# Patient Record
Sex: Male | Born: 1975 | Race: Black or African American | Hispanic: No | Marital: Single | Smoking: Former smoker
Health system: Southern US, Community
[De-identification: ages and names within clinical notes are randomized; demographics above are authoritative.]

## PROBLEM LIST (undated history)

## (undated) DIAGNOSIS — R569 Unspecified convulsions: Secondary | ICD-10-CM

---

## 1998-02-05 ENCOUNTER — Emergency Department (HOSPITAL_COMMUNITY): Admission: EM | Admit: 1998-02-05 | Discharge: 1998-02-05 | Payer: Self-pay | Admitting: Emergency Medicine

## 1998-02-05 ENCOUNTER — Encounter: Payer: Self-pay | Admitting: Emergency Medicine

## 1998-07-01 ENCOUNTER — Emergency Department (HOSPITAL_COMMUNITY): Admission: EM | Admit: 1998-07-01 | Discharge: 1998-07-01 | Payer: Self-pay | Admitting: Emergency Medicine

## 1998-07-01 ENCOUNTER — Encounter: Payer: Self-pay | Admitting: Emergency Medicine

## 1998-09-16 ENCOUNTER — Observation Stay (HOSPITAL_COMMUNITY): Admission: EM | Admit: 1998-09-16 | Discharge: 1998-09-17 | Payer: Self-pay

## 1998-09-16 ENCOUNTER — Encounter: Payer: Self-pay | Admitting: Emergency Medicine

## 1998-11-22 ENCOUNTER — Emergency Department (HOSPITAL_COMMUNITY): Admission: EM | Admit: 1998-11-22 | Discharge: 1998-11-22 | Payer: Self-pay | Admitting: Emergency Medicine

## 1999-03-01 ENCOUNTER — Emergency Department (HOSPITAL_COMMUNITY): Admission: EM | Admit: 1999-03-01 | Discharge: 1999-03-01 | Payer: Self-pay | Admitting: *Deleted

## 1999-09-07 ENCOUNTER — Emergency Department (HOSPITAL_COMMUNITY): Admission: EM | Admit: 1999-09-07 | Discharge: 1999-09-07 | Payer: Self-pay | Admitting: Emergency Medicine

## 2000-12-02 ENCOUNTER — Encounter: Payer: Self-pay | Admitting: Emergency Medicine

## 2000-12-02 ENCOUNTER — Emergency Department (HOSPITAL_COMMUNITY): Admission: EM | Admit: 2000-12-02 | Discharge: 2000-12-02 | Payer: Self-pay | Admitting: Emergency Medicine

## 2001-07-02 ENCOUNTER — Encounter: Payer: Self-pay | Admitting: Emergency Medicine

## 2001-07-02 ENCOUNTER — Emergency Department (HOSPITAL_COMMUNITY): Admission: EM | Admit: 2001-07-02 | Discharge: 2001-07-02 | Payer: Self-pay | Admitting: Emergency Medicine

## 2001-10-24 ENCOUNTER — Emergency Department (HOSPITAL_COMMUNITY): Admission: EM | Admit: 2001-10-24 | Discharge: 2001-10-24 | Payer: Self-pay | Admitting: Emergency Medicine

## 2002-06-21 ENCOUNTER — Emergency Department (HOSPITAL_COMMUNITY): Admission: EM | Admit: 2002-06-21 | Discharge: 2002-06-21 | Payer: Self-pay

## 2002-12-07 ENCOUNTER — Emergency Department (HOSPITAL_COMMUNITY): Admission: EM | Admit: 2002-12-07 | Discharge: 2002-12-07 | Payer: Self-pay | Admitting: Emergency Medicine

## 2003-03-03 ENCOUNTER — Emergency Department (HOSPITAL_COMMUNITY): Admission: EM | Admit: 2003-03-03 | Discharge: 2003-03-03 | Payer: Self-pay | Admitting: Emergency Medicine

## 2003-04-01 ENCOUNTER — Emergency Department (HOSPITAL_COMMUNITY): Admission: EM | Admit: 2003-04-01 | Discharge: 2003-04-02 | Payer: Self-pay | Admitting: *Deleted

## 2003-04-16 ENCOUNTER — Emergency Department (HOSPITAL_COMMUNITY): Admission: EM | Admit: 2003-04-16 | Discharge: 2003-04-16 | Payer: Self-pay | Admitting: Emergency Medicine

## 2003-06-13 ENCOUNTER — Emergency Department (HOSPITAL_COMMUNITY): Admission: EM | Admit: 2003-06-13 | Discharge: 2003-06-13 | Payer: Self-pay | Admitting: Emergency Medicine

## 2004-04-06 ENCOUNTER — Emergency Department (HOSPITAL_COMMUNITY): Admission: EM | Admit: 2004-04-06 | Discharge: 2004-04-06 | Payer: Self-pay | Admitting: Emergency Medicine

## 2004-10-29 ENCOUNTER — Emergency Department (HOSPITAL_COMMUNITY): Admission: EM | Admit: 2004-10-29 | Discharge: 2004-10-29 | Payer: Self-pay | Admitting: Emergency Medicine

## 2005-01-11 ENCOUNTER — Emergency Department (HOSPITAL_COMMUNITY): Admission: EM | Admit: 2005-01-11 | Discharge: 2005-01-11 | Payer: Self-pay | Admitting: Emergency Medicine

## 2005-02-23 ENCOUNTER — Emergency Department (HOSPITAL_COMMUNITY): Admission: EM | Admit: 2005-02-23 | Discharge: 2005-02-23 | Payer: Self-pay | Admitting: Emergency Medicine

## 2005-05-26 ENCOUNTER — Emergency Department (HOSPITAL_COMMUNITY): Admission: EM | Admit: 2005-05-26 | Discharge: 2005-05-26 | Payer: Self-pay | Admitting: Emergency Medicine

## 2005-07-21 ENCOUNTER — Emergency Department (HOSPITAL_COMMUNITY): Admission: EM | Admit: 2005-07-21 | Discharge: 2005-07-21 | Payer: Self-pay | Admitting: Emergency Medicine

## 2006-06-04 ENCOUNTER — Emergency Department (HOSPITAL_COMMUNITY): Admission: EM | Admit: 2006-06-04 | Discharge: 2006-06-04 | Payer: Self-pay | Admitting: Emergency Medicine

## 2009-03-23 ENCOUNTER — Emergency Department (HOSPITAL_COMMUNITY): Admission: EM | Admit: 2009-03-23 | Discharge: 2009-03-24 | Payer: Self-pay | Admitting: Emergency Medicine

## 2010-07-29 LAB — URINALYSIS, ROUTINE W REFLEX MICROSCOPIC
Bilirubin Urine: NEGATIVE
Glucose, UA: NEGATIVE mg/dL
Ketones, ur: NEGATIVE mg/dL
Leukocytes, UA: NEGATIVE
Nitrite: NEGATIVE
Protein, ur: 30 mg/dL — AB
Specific Gravity, Urine: 1.018 (ref 1.005–1.030)
Urobilinogen, UA: 0.2 mg/dL (ref 0.0–1.0)
pH: 6 (ref 5.0–8.0)

## 2010-07-29 LAB — POCT I-STAT, CHEM 8
BUN: 10 mg/dL (ref 6–23)
Calcium, Ion: 1.2 mmol/L (ref 1.12–1.32)
Chloride: 102 mEq/L (ref 96–112)
Creatinine, Ser: 1.1 mg/dL (ref 0.4–1.5)
Glucose, Bld: 58 mg/dL — ABNORMAL LOW (ref 70–99)
HCT: 43 % (ref 39.0–52.0)
Hemoglobin: 14.6 g/dL (ref 13.0–17.0)
Potassium: 3.9 mEq/L (ref 3.5–5.1)
Sodium: 140 mEq/L (ref 135–145)
TCO2: 28 mmol/L (ref 0–100)

## 2010-07-29 LAB — DIFFERENTIAL
Basophils Absolute: 0 10*3/uL (ref 0.0–0.1)
Basophils Relative: 0 % (ref 0–1)
Eosinophils Absolute: 0 10*3/uL (ref 0.0–0.7)
Eosinophils Relative: 0 % (ref 0–5)
Lymphocytes Relative: 17 % (ref 12–46)
Lymphs Abs: 1.2 10*3/uL (ref 0.7–4.0)
Monocytes Absolute: 0.5 10*3/uL (ref 0.1–1.0)
Monocytes Relative: 7 % (ref 3–12)
Neutro Abs: 5.4 10*3/uL (ref 1.7–7.7)
Neutrophils Relative %: 76 % (ref 43–77)

## 2010-07-29 LAB — CBC
HCT: 39.4 % (ref 39.0–52.0)
Hemoglobin: 13.3 g/dL (ref 13.0–17.0)
MCHC: 33.9 g/dL (ref 30.0–36.0)
MCV: 88.3 fL (ref 78.0–100.0)
Platelets: 188 10*3/uL (ref 150–400)
RBC: 4.46 MIL/uL (ref 4.22–5.81)
RDW: 14.4 % (ref 11.5–15.5)
WBC: 7.1 10*3/uL (ref 4.0–10.5)

## 2010-07-29 LAB — URINE MICROSCOPIC-ADD ON

## 2010-07-29 LAB — RAPID URINE DRUG SCREEN, HOSP PERFORMED
Amphetamines: NOT DETECTED
Barbiturates: NOT DETECTED
Benzodiazepines: NOT DETECTED
Cocaine: NOT DETECTED
Opiates: NOT DETECTED
Tetrahydrocannabinol: POSITIVE — AB

## 2010-07-29 LAB — GLUCOSE, CAPILLARY: Glucose-Capillary: 128 mg/dL — ABNORMAL HIGH (ref 70–99)

## 2012-02-01 ENCOUNTER — Emergency Department (HOSPITAL_COMMUNITY)
Admission: EM | Admit: 2012-02-01 | Discharge: 2012-02-01 | Disposition: A | Discharge status: Home / Self Care | Payer: Self-pay | Attending: Emergency Medicine | Admitting: Emergency Medicine

## 2012-02-01 ENCOUNTER — Encounter (HOSPITAL_COMMUNITY): Payer: Self-pay

## 2012-02-01 DIAGNOSIS — G40909 Epilepsy, unspecified, not intractable, without status epilepticus: Secondary | ICD-10-CM | POA: Insufficient documentation

## 2012-02-01 DIAGNOSIS — R569 Unspecified convulsions: Secondary | ICD-10-CM

## 2012-02-01 DIAGNOSIS — Z91038 Other insect allergy status: Secondary | ICD-10-CM | POA: Insufficient documentation

## 2012-02-01 DIAGNOSIS — Z9114 Patient's other noncompliance with medication regimen: Secondary | ICD-10-CM

## 2012-02-01 DIAGNOSIS — Z9119 Patient's noncompliance with other medical treatment and regimen: Secondary | ICD-10-CM | POA: Insufficient documentation

## 2012-02-01 DIAGNOSIS — Z91199 Patient's noncompliance with other medical treatment and regimen due to unspecified reason: Secondary | ICD-10-CM | POA: Insufficient documentation

## 2012-02-01 HISTORY — DX: Unspecified convulsions: R56.9

## 2012-02-01 LAB — PHENYTOIN LEVEL, TOTAL: Phenytoin Lvl: <2.5 ug/mL — ABNORMAL LOW (ref 10.0–20.0)

## 2012-02-01 MED ORDER — NAPROXEN 500 MG PO TABS
500.0000 mg | ORAL_TABLET | Freq: Once | ORAL | Status: AC
  Administered 2012-02-01: 500 mg via ORAL
  Filled 2012-02-01: qty 1

## 2012-02-01 MED ORDER — LORAZEPAM 2 MG/ML IJ SOLN
1.0000 mg | Freq: Once | INTRAMUSCULAR | Status: AC
  Administered 2012-02-01: 1 mg via INTRAVENOUS
  Filled 2012-02-01: qty 1

## 2012-02-01 MED ORDER — PHENYTOIN SODIUM EXTENDED 100 MG PO CAPS
400.0000 mg | ORAL_CAPSULE | ORAL | Status: AC
  Administered 2012-02-01: 400 mg via ORAL
  Filled 2012-02-01: qty 4

## 2012-02-01 MED ORDER — PHENYTOIN SODIUM EXTENDED 100 MG PO CAPS
400.0000 mg | ORAL_CAPSULE | ORAL | Status: AC
  Administered 2012-02-01: 400 mg via ORAL
  Filled 2012-02-01: qty 2

## 2012-02-01 MED ORDER — PHENYTOIN SODIUM EXTENDED 300 MG PO CAPS: 300.0000 mg | ORAL_CAPSULE | Freq: Every day | ORAL | Status: AC

## 2012-02-01 MED ORDER — PHENYTOIN SODIUM EXTENDED 100 MG PO CAPS: 400.0000 mg | ORAL_CAPSULE | ORAL | Status: DC

## 2012-02-01 MED ORDER — PHENYTOIN SODIUM EXTENDED 100 MG PO CAPS
400.0000 mg | ORAL_CAPSULE | ORAL | Status: AC
  Administered 2012-02-01: 400 mg via ORAL
  Filled 2012-02-01: qty 2
  Filled 2012-02-01: qty 4

## 2012-02-01 NOTE — ED Notes (Signed)
Pt had an 30 seconds episode of grand mal seizure activity witnessed by co-worker. EMS was called, pt was post-itcal but now alert, and oriented x 4. Had a fall during seizure, hematoma noted on his left eyebrow. Present to ED on C-collar and LSB. Only complaint are lower back pain and left forehead pain. No incontinence, no tongue injury. No skin injury noted, Full ROM on all extremities.

## 2012-02-01 NOTE — ED Provider Notes (Signed)
History     CSN: [id number]  Arrival date & time 02/01/12  1342   First MD Initiated Contact with Patient 02/01/12 1404      Chief Complaint  Patient presents with  . Seizures    (Consider location/radiation/quality/duration/timing/severity/associated sxs/prior treatment) HPIChad L Cryderman is a 36 y.o. male presents to the ER after a seizure. Patient says he typically has a seizure a year and it usually occurs when he doesn't take his medicine for a long period time. Is not taken his Dilantin for at least a week.  Patient was at work when he had a witnessed 32nd grand mal seizure and EMS was called. Patient was placed in a c-collar and was initially post ictal period. Patient did fall and strike the left lateral superior orbital rim, he says this does not hurt only when palpated, he has a mild headache 1/10 in intensity. He describes no nausea no vomiting. He says he did not bite his tongue. He had incontinence, typical of his seizure.  He has no complaints of any other pain and denies any recent illness, fever, chills, chest pain, shortness of breath, alcohol or illegal drug use.   Past Medical History  Diagnosis Date  . Seizure     History reviewed. No pertinent past surgical history.  No family history on file.  History  Substance Use Topics  . Smoking status: Not on file  . Smokeless tobacco: Not on file  . Alcohol Use: No      Review of Systems At least 10pt or greater review of systems completed and are negative except where specified in the HPI.  Allergies  Bee venom  Home Medications   Current Outpatient Rx  Name Route Sig Dispense Refill  . ADULT MULTIVITAMIN W/MINERALS CH Oral Take 1 tablet by mouth daily.    Marland Kitchen PHENYTOIN SODIUM EXTENDED 100 MG PO CAPS Oral Take 300 mg by mouth daily.      BP 145/76  Pulse 82  Temp 98.3 F (36.8 C)  Resp 20  SpO2 99%  Physical Exam  Nursing notes reviewed.  Electronic medical record reviewed. VITAL SIGNS:   Filed  Vitals:   02/01/12 1353  BP: 145/76  Pulse: 82  Temp: 98.3 F (36.8 C)  Resp: 20  SpO2: 99%   CONSTITUTIONAL: Awake, oriented, appears non-toxic HENT: Small hematoma on the left superior, lateral orbital ,with mild tenderness to palpation, normocephalic, oral mucosa pink and moist, airway patent. Nares patent without drainage. External ears normal. EYES: Conjunctiva clear, EOMI, PERRLA NECK: Trachea midline, non-tender, supple CARDIOVASCULAR: Normal heart rate, Normal rhythm, No murmurs, rubs, gallops PULMONARY/CHEST: Clear to auscultation, no rhonchi, wheezes, or rales. Symmetrical breath sounds. Non-tender. ABDOMINAL: Non-distended, soft, non-tender - no rebound or guarding.  BS normal. NEUROLOGIC: Non-focal, moving all four extremities, no gross sensory or motor deficits. EXTREMITIES: No clubbing, cyanosis, or edema SKIN: Warm, Dry, No erythema, No rash  ED Course  Procedures (including critical care time)  Labs Reviewed  PHENYTOIN LEVEL, TOTAL - Abnormal; Notable for the following:    Phenytoin Lvl <2.5 (*)     All other components within normal limits  LAB REPORT - SCANNED   No results found.   1. Seizure   2. Seizure disorder   3. Noncompliance with medication regimen       MDM  [place] L Keinath is a 36 y.o. male resenting procedure after he's been noncompliant with his medication. He says this happens annually.  We'll obtain a phenytoin level to  determine loading dose, loaded him up with phenytoin and write a prescription. Given followup with neurology and a resource guide. Patient does have a history of noncompliance - stressed following up in with neurologist to obtain occasional Dilantin levels. Have also stressed taking his medication.  Discuss with pharmacy about loading dose - they will send a one-time order of 1600 mg for the patient. Patient does not want to wait 2 hours while he becomes therapeutic, I described to him the risks and benefits of leaving the ER  including having another seizure, falling during a seizure, injuring himself he understands accepts the risks and has capacity to make that decision. Give him a small dose of IV Ativan prior to discontinuing his IV to increase his seizure threshold.  I explained the diagnosis and have given explicit precautions to return to the ER including worsening seizures despite medications or any other new or worsening symptoms. The patient understands and accepts the medical plan as it's been dictated and I have answered their questions. Discharge instructions concerning home care and prescriptions have been given.  The patient is STABLE and is discharged to home in good condition.           Jones Skene, MD 02/02/12 1630

## 2012-02-01 NOTE — ED Notes (Addendum)
Ice pack given to pt for swelling of left orbit.

## 2012-02-01 NOTE — ED Notes (Signed)
Pt off spinal board, C-collar remains.

## 2012-02-01 NOTE — ED Notes (Signed)
Pt do have incontinence from the seizure

## 2012-02-01 NOTE — ED Notes (Signed)
Pt reports not taking his Dilantin regularly, out of prescription, Seizure precaution implemented.

## 2021-04-29 ENCOUNTER — Other Ambulatory Visit (HOSPITAL_COMMUNITY): Payer: Self-pay

## 2021-04-29 MED ORDER — PENICILLIN V POTASSIUM 500 MG PO TABS
500.0000 mg | ORAL_TABLET | Freq: Four times a day (QID) | ORAL | 0 refills | Status: AC
  Filled 2021-04-29: qty 28, 7d supply, fill #0

## 2021-04-30 ENCOUNTER — Other Ambulatory Visit (HOSPITAL_COMMUNITY): Payer: Self-pay

## 2021-04-30 MED ORDER — HYDROCODONE-ACETAMINOPHEN 5-325 MG PO TABS
1.0000 | ORAL_TABLET | Freq: Four times a day (QID) | ORAL | 0 refills | Status: AC | PRN
  Filled 2021-04-30: qty 15, 4d supply, fill #0

## 2021-07-03 ENCOUNTER — Emergency Department (HOSPITAL_BASED_OUTPATIENT_CLINIC_OR_DEPARTMENT_OTHER)
Admission: EM | Admit: 2021-07-03 | Discharge: 2021-07-03 | Disposition: A | Discharge status: Home / Self Care | Payer: Self-pay | Attending: Emergency Medicine | Admitting: Emergency Medicine

## 2021-07-03 ENCOUNTER — Other Ambulatory Visit (HOSPITAL_BASED_OUTPATIENT_CLINIC_OR_DEPARTMENT_OTHER): Payer: Self-pay

## 2021-07-03 ENCOUNTER — Other Ambulatory Visit: Payer: Self-pay

## 2021-07-03 ENCOUNTER — Encounter (HOSPITAL_BASED_OUTPATIENT_CLINIC_OR_DEPARTMENT_OTHER): Payer: Self-pay | Admitting: Obstetrics and Gynecology

## 2021-07-03 DIAGNOSIS — L03317 Cellulitis of buttock: Secondary | ICD-10-CM | POA: Insufficient documentation

## 2021-07-03 MED ORDER — DOXYCYCLINE HYCLATE 100 MG PO CAPS
100.0000 mg | ORAL_CAPSULE | Freq: Two times a day (BID) | ORAL | 0 refills | Status: AC
  Filled 2021-07-03: qty 14, 7d supply, fill #0

## 2021-07-03 NOTE — ED Provider Notes (Signed)
? ?Emergency Department Provider Note ? ? ?I have reviewed the triage vital signs and the nursing notes. ? ? ?HISTORY ? ?Chief Complaint ?Abscess ? ? ?HPI ?James Zavala is a 46 y.o. male with PMH reviewed below presents to the ED with left gluteal pain and redness. Symptoms progressing over the last week. He notes that several days ago the area with about twice the size that it is now but that his girlfriend has been squeezing and draining the area and expressing pus material. No fever or chills. No perirectal pain.  ? ? ? ?Past Medical History:  ?Diagnosis Date  ? Seizure (HCC)   ? ? ?Review of Systems ? ?Constitutional: No fever/chills ?Cardiovascular: Denies chest pain. ?Respiratory: Denies shortness of breath. ?Gastrointestinal: No abdominal pain.   ?Skin: Painful rash to buttock.  ? ? ?____________________________________________ ? ? ?PHYSICAL EXAM: ? ?VITAL SIGNS: ?ED Triage Vitals  ?Enc Vitals Group  ?   BP 07/03/21 1225 (!) 159/110  ?   Pulse Rate 07/03/21 1225 92  ?   Resp 07/03/21 1225 15  ?   Temp 07/03/21 1225 98.3 ?F (36.8 ?C)  ?   Temp Source 07/03/21 1225 Oral  ?   SpO2 07/03/21 1225 100 %  ?   Weight 07/03/21 1224 190 lb (86.2 kg)  ?   Height 07/03/21 1224 6\' 1"  (1.854 m)  ? ?Constitutional: Alert and oriented. Well appearing and in no acute distress. ?Eyes: Conjunctivae are normal.  ?Head: Atraumatic. ?Nose: No congestion/rhinnorhea. ?Mouth/Throat: Mucous membranes are moist.  ?Neck: No stridor.  ?Cardiovascular: Good peripheral circulation.  ?Respiratory: Normal respiratory effort.   ?Gastrointestinal: No distention.  ?Musculoskeletal: No gross deformities of extremities. ?Neurologic:  Normal speech and language.  ?Skin:  Skin is warm and dry. 4 cm circular area to the left lateral buttock. No fluctuance. No drainage. No crepitus.  ? ? ?____________________________________________ ? ? ?PROCEDURES ? ?Procedure(s) performed:  ? ?Procedures ? ?EMERGENCY DEPARTMENT SOFT TISSUE  INTERPRETATION ?"Study: Limited Soft Tissue Ultrasound" ? ?INDICATIONS: Pain and Soft tissue infection ?Multiple views of the body part were obtained in real-time with a multi-frequency linear probe ?PERFORMED BY:  Myself ?SIDE:Left ?BODY PART: buttock ?FINDINGS: No abcess noted and Cellulitis present ?INTERPRETATION:  No abcess noted and Cellulitis present ? ? ?CPT: ? ?Lower extremity [phone] ? ? ?____________________________________________ ? ? ?INITIAL IMPRESSION / ASSESSMENT AND PLAN / ED COURSE ? ?Pertinent labs & imaging results that were available during my care of the patient were reviewed by me and considered in my medical decision making (see chart for details). ?  ?This patient is Presenting for Evaluation of skin infection, which does require a range of treatment options, and is a complaint that involves a high risk of morbidity and mortality. ? ?The Differential Diagnoses include abscess, cellulitis, fascitis, SIRS, sepsis. ?  ?Clinical Laboratory Tests: Considered need for labs but vitals are not consistent with SIRS or sepsis. Eaxm not consistent with deeper space abscess or fascitis.  ? ?Radiologic Tests: Considered need for CT imaging but bedside [id number] performed as above. No large fluid collection.  ? ?Social Determinants of Health Risk patient with a smoking history.  ? ?Medical Decision Making: Summary:  ?Patient presents to the ED with clinically improving area of cellulitis. Sounds consistent with abscess previously but apparently at least partially drained at home. No significant fluid collection on [place]. Plan to start abx here and warm compresses.  ? ?Reevaluation with update and discussion with patient. Discussed plan and ED return precautions.  ? ?  Disposition: discharge ? ?____________________________________________ ? ?FINAL CLINICAL IMPRESSION(S) / ED DIAGNOSES ? ?Final diagnoses:  ?Cellulitis of buttock  ? ? ? ?NEW OUTPATIENT MEDICATIONS STARTED DURING THIS VISIT: ? ?Discharge Medication List  as of 07/03/2021 12:50 PM  ?  ? ?START taking these medications  ? Details  ?doxycycline (VIBRAMYCIN) 100 MG capsule Take 1 capsule (100 mg total) by mouth 2 (two) times daily for 7 days., Starting Fri 07/03/2021, Until Fri 07/10/2021, Normal  ?  ?  ? ? ?Note:  This document was prepared using Dragon voice recognition software and may include unintentional dictation errors. ? ?Alona Bene, MD, FACEP ?Emergency Medicine ? ?  ?Maia Plan, MD ?07/04/21 1354 ? ?

## 2021-07-03 NOTE — Discharge Instructions (Signed)
You are seen in the emergency room today with cellulitis or skin infection of the left gluteal area.  I do not appreciate any drainage on ultrasound that requires incision.  Please take the antibiotic as prescribed.  You can continue to use a warm compress in the area.  If you notice that the size or redness begin to increase or you develop fever you should be reevaluated. ?

## 2021-07-03 NOTE — ED Notes (Signed)
Patient verbalizes understanding of discharge instructions. Opportunity for questioning and answers were provided. Armband removed by staff, pt discharged from ED. Pt. ambulatory and discharged home.  

## 2021-07-03 NOTE — ED Triage Notes (Signed)
Patient reports x1 week of left butt cheek abscess. Patient reports it has leaked some but it still causing pain ?

## 2021-09-02 ENCOUNTER — Emergency Department (HOSPITAL_BASED_OUTPATIENT_CLINIC_OR_DEPARTMENT_OTHER): Payer: Self-pay | Admitting: Radiology

## 2021-09-02 ENCOUNTER — Emergency Department (HOSPITAL_BASED_OUTPATIENT_CLINIC_OR_DEPARTMENT_OTHER)
Admission: EM | Admit: 2021-09-02 | Discharge: 2021-09-02 | Disposition: A | Discharge status: Home / Self Care | Payer: Self-pay | Attending: Emergency Medicine | Admitting: Emergency Medicine

## 2021-09-02 ENCOUNTER — Encounter (HOSPITAL_BASED_OUTPATIENT_CLINIC_OR_DEPARTMENT_OTHER): Payer: Self-pay | Admitting: Emergency Medicine

## 2021-09-02 ENCOUNTER — Other Ambulatory Visit: Payer: Self-pay

## 2021-09-02 DIAGNOSIS — M25462 Effusion, left knee: Secondary | ICD-10-CM | POA: Insufficient documentation

## 2021-09-02 DIAGNOSIS — M1712 Unilateral primary osteoarthritis, left knee: Secondary | ICD-10-CM | POA: Insufficient documentation

## 2021-09-02 LAB — GRAM STAIN

## 2021-09-02 LAB — SYNOVIAL CELL COUNT + DIFF, W/ CRYSTALS
Crystals, Fluid: NONE SEEN
Eosinophils-Synovial: 0 % (ref 0–1)
Lymphocytes-Synovial Fld: 11 % (ref 0–20)
Monocyte-Macrophage-Synovial Fluid: 71 % (ref 50–90)
Neutrophil, Synovial: 18 % (ref 0–25)
WBC, Synovial: 565 /mm3 — ABNORMAL HIGH (ref 0–200)

## 2021-09-02 MED ORDER — LIDOCAINE-EPINEPHRINE (PF) 2 %-1:200000 IJ SOLN
10.0000 mL | Freq: Once | INTRAMUSCULAR | Status: AC
  Administered 2021-09-02: 10 mL
  Filled 2021-09-02: qty 20

## 2021-09-02 MED ORDER — IBUPROFEN 800 MG PO TABS
800.0000 mg | ORAL_TABLET | Freq: Once | ORAL | Status: AC
  Administered 2021-09-02: 800 mg via ORAL
  Filled 2021-09-02: qty 1

## 2021-09-02 MED ORDER — LIDOCAINE-EPINEPHRINE 2 %-1:100000 IJ SOLN
20.0000 mL | Freq: Once | INTRAMUSCULAR | Status: DC
  Filled 2021-09-02: qty 20.4

## 2021-09-02 NOTE — ED Provider Notes (Addendum)
?[facility] DEPT ?Provider Note ? ? ?CSN: [id number] ?Arrival date & time: 09/02/21  [id number] ? ?  ? ?History ? ?Chief Complaint  ?Patient presents with  ? Knee Pain  ? ? ?James Zavala is a 46 y.o. male. ? ? ?Knee Pain ? ?46 year old male with history of knee osteoarthritis and joint effusions presenting to the emergency department with left knee pain and swelling.  He states that he has been ongoing for the past 4 days.  He has not had significant effusion of the left knee for the last 10 years.  He denies any history of gout or septic arthritis.  He denies any fevers or chills.  Endorses worsening pain with range of motion of the left knee. ? ?Home Medications ?Prior to Admission medications   ?Medication Sig Start Date End Date Taking? Authorizing Provider  ?ibuprofen (ADVIL) 200 MG tablet Take 200 mg by mouth every 6 (six) hours as needed.   Yes [provider]  ?HYDROcodone-acetaminophen (NORCO/VICODIN) 5-325 MG tablet Take 1 tablet by mouth every 6 (six) hours as needed for pain ?Patient not taking: Reported on 09/02/2021 04/30/21     ?penicillin v potassium (VEETID) 500 MG tablet Take 1 tablet (500 mg total) by mouth 4 (four) times daily until gone ?Patient not taking: Reported on 09/02/2021 04/29/21     ?phenytoin (DILANTIN) 300 MG ER capsule Take 1 capsule (300 mg total) by mouth daily. ?Patient not taking: Reported on 09/02/2021 02/01/12   Rhunette Croft, MD  ?   ? ?Allergies    ?Bee venom   ? ?Review of Systems   ?Review of Systems  ?All other systems reviewed and are negative. ? ?Physical Exam ?Updated Vital Signs ?BP (!) 154/98 (BP Location: Right Arm)   Pulse (!) 54   Temp 98.1 ?F (36.7 ?C) (Oral)   Resp 18   SpO2 100%  ?Physical Exam ?Vitals and nursing note reviewed.  ?Constitutional:   ?   General: He is not in acute distress. ?HENT:  ?   Head: Normocephalic and atraumatic.  ?Eyes:  ?   Conjunctiva/sclera: Conjunctivae normal.  ?   Pupils: Pupils are equal, round, and  reactive to light.  ?Cardiovascular:  ?   Rate and Rhythm: Normal rate and regular rhythm.  ?Pulmonary:  ?   Effort: Pulmonary effort is normal. No respiratory distress.  ?Abdominal:  ?   General: There is no distension.  ?   Tenderness: There is no guarding.  ?Musculoskeletal:     ?   General: Swelling and tenderness present. No deformity or signs of injury.  ?   Cervical back: Neck supple.  ?   Comments: Left knee with palpable effusion present, pain with range of motion, distally neurovascular intact  ?Skin: ?   Findings: No lesion or rash.  ?   Comments: No surrounding erythema or warmth of the knee.  ?Neurological:  ?   General: No focal deficit present.  ?   Mental Status: He is alert. Mental status is at baseline.  ? ? ?ED Results / Procedures / Treatments   ?Labs ?(all labs ordered are listed, but only abnormal results are displayed) ?Labs Reviewed  ?SYNOVIAL CELL COUNT + DIFF, W/ CRYSTALS - Abnormal; Notable for the following components:  ?    Result Value  ? Appearance-Synovial HAZY (*)   ? WBC, Synovial 565 (*)   ? All other components within normal limits  ?BODY FLUID CULTURE W GRAM STAIN  ?CULTURE, BODY FLUID W Lonell Grandchild  STAIN -BOTTLE  ?GRAM STAIN  ?GLUCOSE, BODY FLUID OTHER            ?PROTEIN, BODY FLUID (OTHER)  ? ? ?EKG ?None ? ?Radiology ?DG Knee Complete 4 Views Left ? ?Result Date: 09/02/2021 ?CLINICAL DATA:  Knee pain and swelling.  No known injury. EXAM: LEFT KNEE - COMPLETE 4+ VIEW COMPARISON:  Left knee radiographs 07/21/2005 FINDINGS: New mild medial compartment joint space narrowing and moderate peripheral degenerative osteophytes. New moderate patellofemoral joint space narrowing and mild-to-moderate peripheral inferior greater than superior patellar degenerative osteophytes. Unchanged chronic ossicle measuring up to approximately 13 mm at the superior aspect of the tibial tubercle, likely the sequela of remote Osgood-Schlatter disease. Unchanged tiny approximate 3 mm ossicle just inferior to  the patella. No knee joint effusion (resolution of the prior large knee joint effusion. No acute fracture or dislocation. IMPRESSION: Compared to 07/21/2005: 1. New mild-to-moderate medial compartment osteoarthritis. 2. New mild patellofemoral osteoarthritis. 3. Resolution of the prior knee joint effusion. 4. Unchanged chronic ossicle at the superior aspect of the tibial tubercle, likely the sequela of remote Osgood-Schlatter disease. Electronically Signed   By: Yvonne Kendall M.D.   On: 09/02/2021 10:35   ? ?Procedures ?.Joint Aspiration/Arthrocentesis ? ?Date/Time: 09/02/2021 10:46 AM ?Performed by: Regan Lemming, MD ?Authorized by: Regan Lemming, MD  ? ?Consent:  ?  Consent obtained:  Verbal ?  Consent given by:  Patient ?  Risks discussed:  Bleeding, infection, pain and incomplete drainage ?  Alternatives discussed:  No treatment ?Universal protocol:  ?  Patient identity confirmed:  Verbally with patient ?Location:  ?  Location:  Knee ?  Knee:  L knee ?Anesthesia:  ?  Anesthesia method:  Local infiltration ?  Local anesthetic:  Lidocaine 1% WITH epi ?Procedure details:  ?  Preparation: Patient was prepped and draped in usual sterile fashion   ?  Needle gauge:  18 G ?  Ultrasound guidance: yes   ?  Approach:  Medial ?  Aspirate amount:  50cc ?  Aspirate characteristics:  Yellow ?  Steroid injected: no   ?  Specimen collected: yes   ?Post-procedure details:  ?  Dressing:  Adhesive bandage ?  Procedure completion:  Tolerated  ? ? ?Medications Ordered in ED ?Medications  ?ibuprofen (ADVIL) tablet 800 mg (800 mg Oral Given 09/02/21 1020)  ?lidocaine-EPINEPHrine (XYLOCAINE W/EPI) 2 %-1:200000 (PF) injection 10 mL (10 mLs Infiltration Given 09/02/21 1025)  ? ? ?ED Course/ Medical Decision Making/ A&P ?  ?                        ?Medical Decision Making ?Amount and/or Complexity of Data Reviewed ?Labs: ordered. ?Radiology: ordered. ? ?Risk ?Prescription drug management. ? ? ?46 year old male with history of knee  osteoarthritis and joint effusions presenting to the emergency department with left knee pain and swelling.  He states that he has been ongoing for the past 4 days.  He has not had significant effusion of the left knee for the last 10 years.  He denies any history of gout or septic arthritis.  He denies any fevers or chills.  Endorses worsening pain with range of motion of the left knee. ? ?On arrival, the patient was vitally stable.  No significant erythema or warmth.  Differential diagnosis includes most likely osteoarthritis and joint effusion, less likely gout or septic arthritis.  Will obtain arthrocentesis to rule out septic arthritis and evaluate for possible gout. ? ?X-ray imaging was obtained  and significant for osteoarthritis of the knee. ?IMPRESSION:  ?Compared to 07/21/2005:  ?   ?1. New mild-to-moderate medial compartment osteoarthritis.  ?2. New mild patellofemoral osteoarthritis.  ?3. Resolution of the prior knee joint effusion.  ?4. Unchanged chronic ossicle at the superior aspect of the tibial  ?tubercle, likely the sequela of remote Osgood-Schlatter disease.  ? ?Arthrocentesis was performed as per the procedure note above and around 50 cc of yellow fluid was aspirated from the knee joint.  The patient felt immediate symptomatic improvement following this.  The fluid was not significantly cloudy.  It was sent for culture, cell count for further evaluation.  The fluid unfortunately had to be sent to the [facility] for analysis.  While awaiting laboratory results, the patient walked out and eloped from the emergency department. ? ?Synovial fluid resulted without evidence of septic arthritis or gout, 568 WBCs present, no crystals seen.  Attempts were made to contact the patient regarding his results, no indication for callback to the ED at this time. ? ? ?Final Clinical Impression(s) / ED Diagnoses ?Final diagnoses:  ?Osteoarthritis of left knee, unspecified osteoarthritis type   ?Effusion of left knee  ? ? ?Rx / DC Orders ?ED Discharge Orders   ? ? None  ? ?  ? ? ?  ?Regan Lemming, MD ?09/02/21 1453 ? ?  ?Regan Lemming, MD ?09/02/21 1453 ? ?

## 2021-09-02 NOTE — ED Triage Notes (Signed)
Pt presents with left knee swelling for 4 days, stated he had fluid on his knee about 10 years ago.  ?

## 2021-09-03 LAB — GLUCOSE, BODY FLUID OTHER: Glucose, Body Fluid Other: 116 mg/dL

## 2021-09-03 LAB — PROTEIN, BODY FLUID (OTHER): Total Protein, Body Fluid Other: 3.7 g/dL

## 2021-09-04 LAB — CULTURE, BODY FLUID W GRAM STAIN -BOTTLE

## 2021-09-05 ENCOUNTER — Telehealth (HOSPITAL_BASED_OUTPATIENT_CLINIC_OR_DEPARTMENT_OTHER): Payer: Self-pay | Admitting: *Deleted

## 2021-09-05 NOTE — Telephone Encounter (Signed)
Post ED Visit - Positive Culture Follow-up ? ?Culture report reviewed by antimicrobial stewardship pharmacist: ?[facility] Team ?[]  Elenor Quinones, Pharm.D. ?[]  Heide Guile, Pharm.D., BCPS AQ-ID ?[]  Parks Neptune, Pharm.D., BCPS ?[]  Alycia Rossetti, Pharm.D., BCPS ?[]  [place], Pharm.D., BCPS, AAHIVP ?[]  Legrand Como, Pharm.D., BCPS, AAHIVP ?[]  Salome Arnt, PharmD, BCPS ?[]  Johnnette Gourd, PharmD, BCPS ?[]  Hughes Better, PharmD, BCPS ?[]  Leeroy Cha, PharmD ?[]  Laqueta Linden, PharmD, BCPS ?[x]  Albertina Parr, PharmD ? ?[facility] Team ?[]  Leodis Sias, PharmD ?[]  Lindell Spar, PharmD ?[]  Royetta Asal, PharmD ?[]  Graylin Shiver, Rph ?[]  Rema Fendt) Glennon Mac, PharmD ?[]  Arlyn Dunning, PharmD ?[]  Netta Cedars, PharmD ?[]  Dia Sitter, PharmD ?[]  Leone Haven, PharmD ?[]  Gretta Arab, PharmD ?[]  Theodis Shove, PharmD ?[]  Peggyann Juba, PharmD ?[]  Reuel Boom, PharmD ? ? ?Positive body fluid culture ?Without evidence of septic arthritis and no further patient follow-up is required at this time per Myna Bright, PA-C ? ?James Zavala ?09/05/2021, 11:07 AM ?  ?

## 2023-02-14 IMAGING — DX DG KNEE COMPLETE 4+V*L*
4 series · 4 of 4 positions shown · non-contrast
Comparison: Left knee radiographs 07/21/2005

CLINICAL DATA: Knee pain and swelling.  No known injury.

EXAM:
LEFT KNEE - COMPLETE 4+ VIEW

[knee ap]
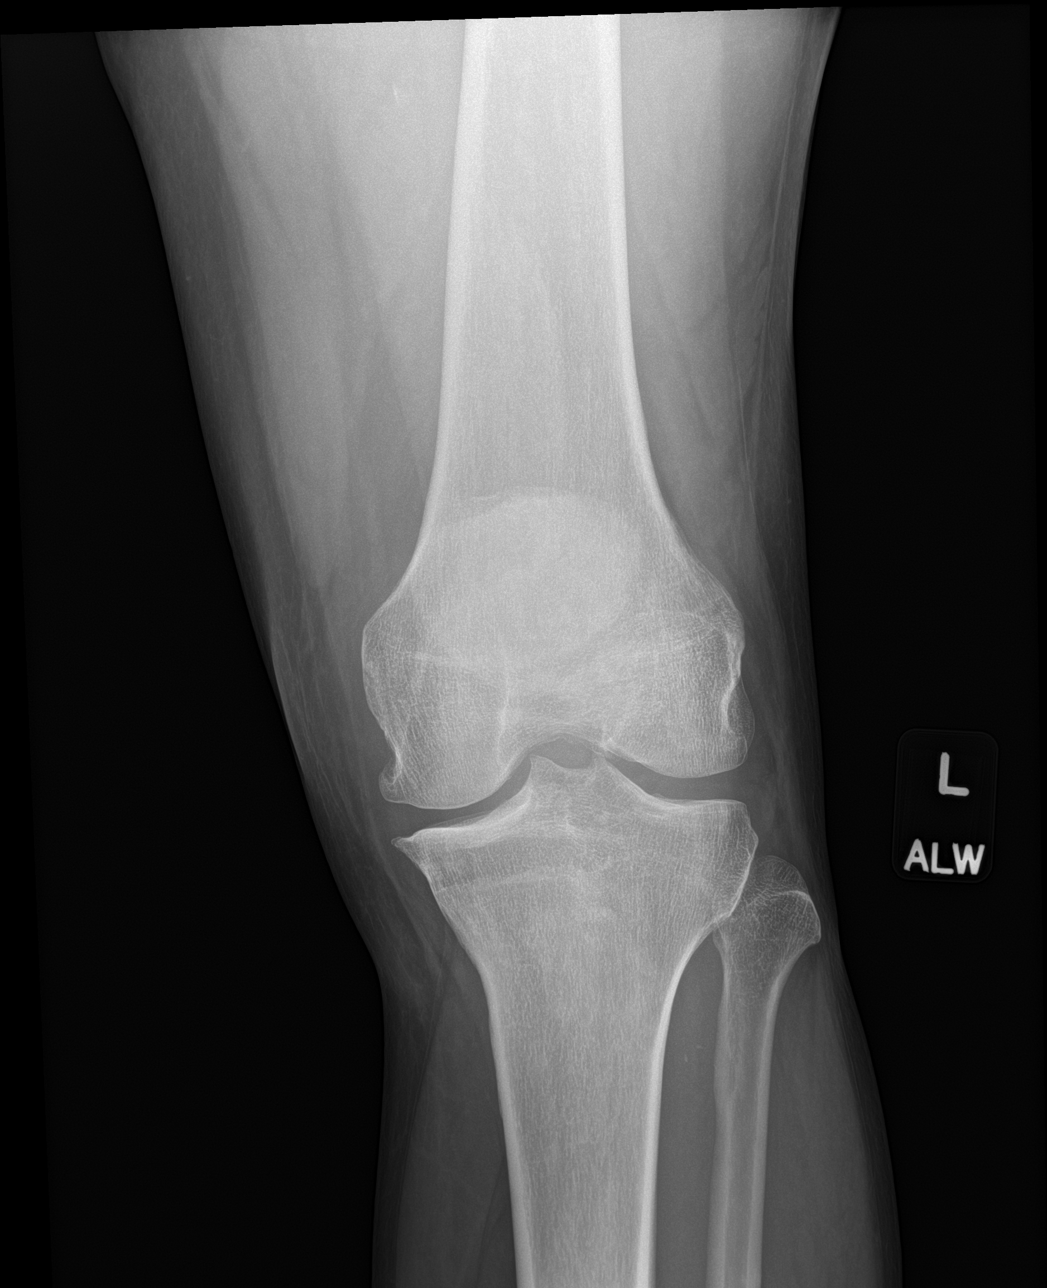

[knee lat]
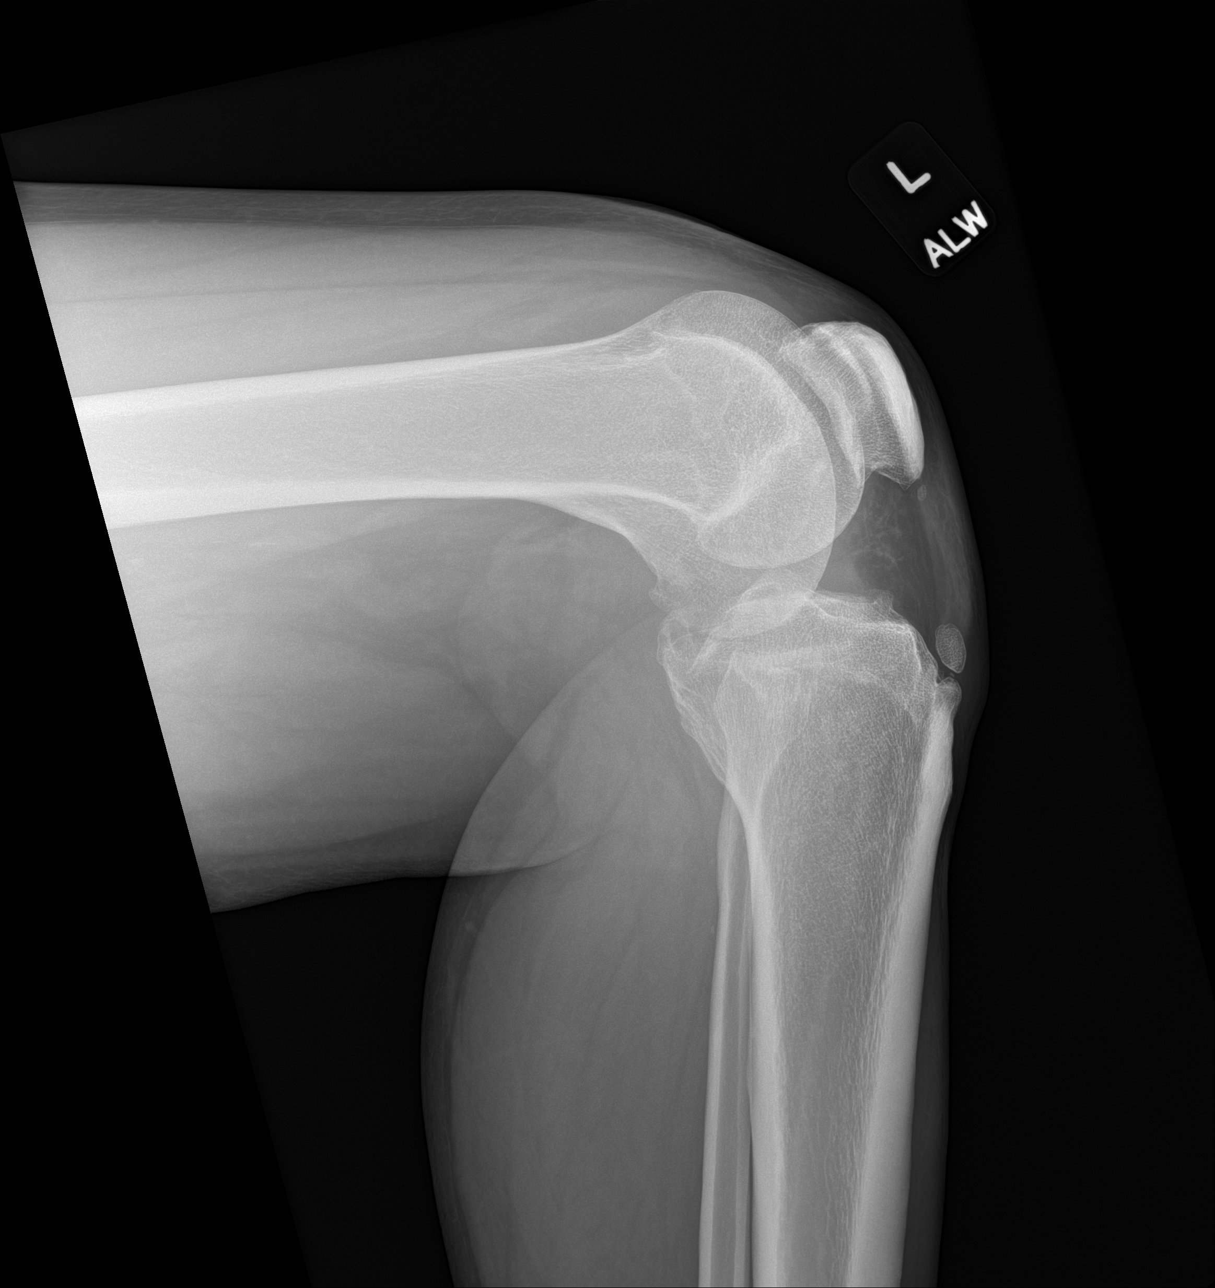

[knee obl (1 of 2)]
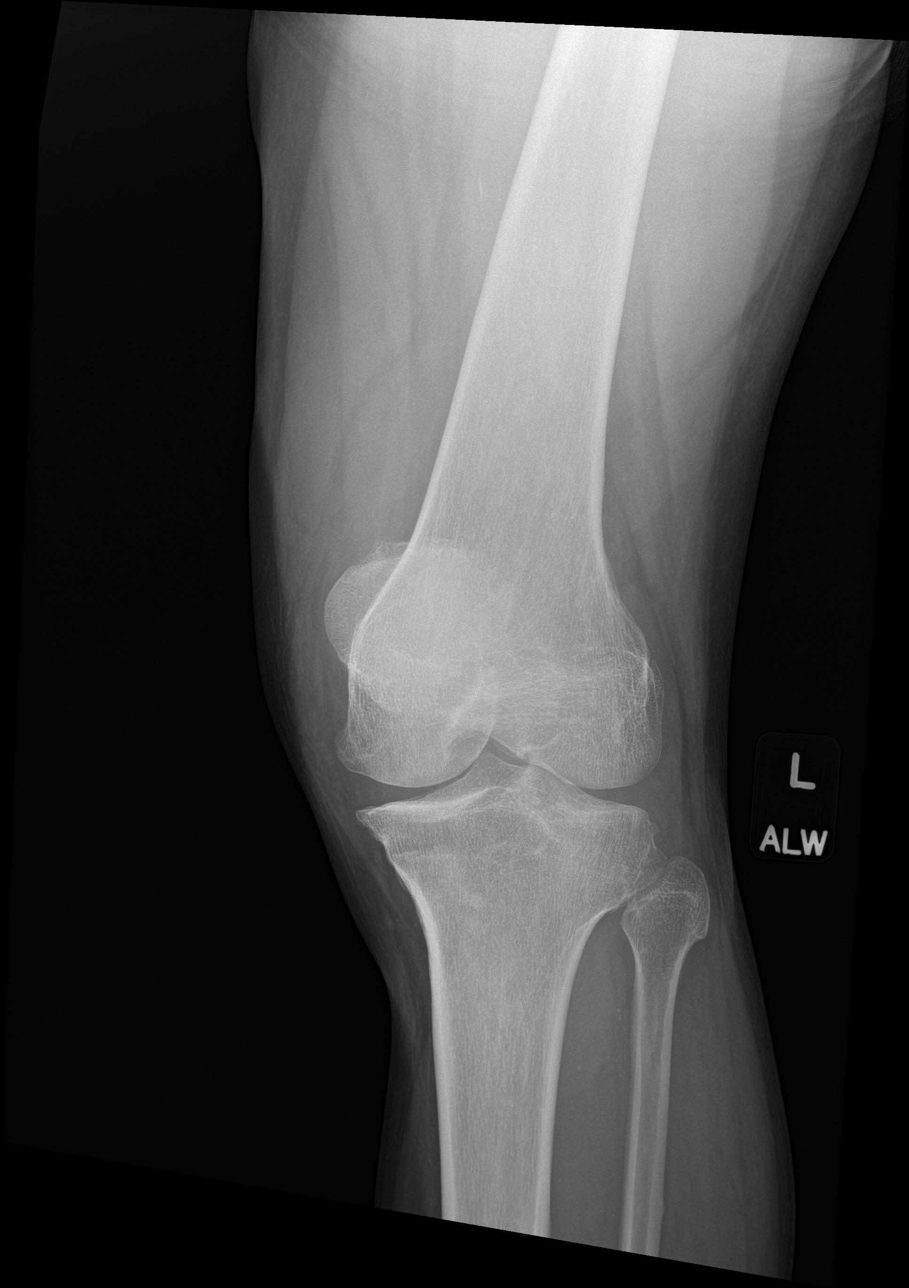

[knee obl (2 of 2)]
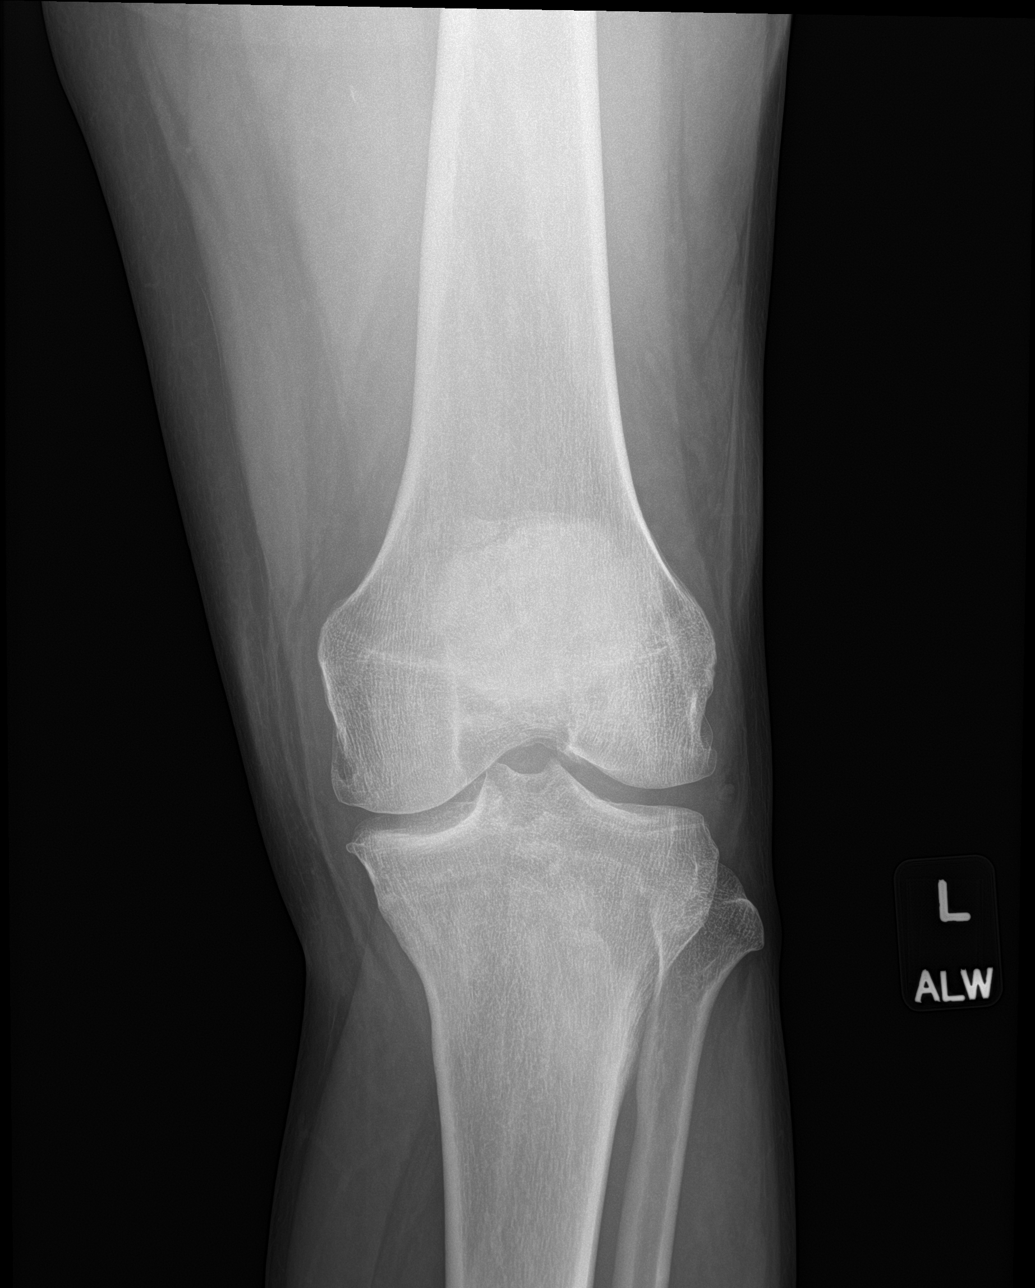

[4 of 4 positions shown; findings below may reference images not displayed]

FINDINGS: New mild medial compartment joint space narrowing and moderate
peripheral degenerative osteophytes. New moderate patellofemoral
joint space narrowing and mild-to-moderate peripheral inferior
greater than superior patellar degenerative osteophytes.

Unchanged chronic ossicle measuring up to approximately 13 mm at the
superior aspect of the tibial tubercle, likely the sequela of remote
Osgood-Schlatter disease. Unchanged tiny approximate 3 mm ossicle
just inferior to the patella.

No knee joint effusion (resolution of the prior large knee joint
effusion.

No acute fracture or dislocation.
IMPRESSION: Compared to 07/21/2005:

1. New mild-to-moderate medial compartment osteoarthritis.
2. New mild patellofemoral osteoarthritis.
3. Resolution of the prior knee joint effusion.
4. Unchanged chronic ossicle at the superior aspect of the tibial
tubercle, likely the sequela of remote Osgood-Schlatter disease.
